# Patient Record
Sex: Male | Born: 2000 | Hispanic: No | Marital: Single | Smoking: Never smoker
Health system: Southern US, Community
[De-identification: ages and names within clinical notes are randomized; demographics above are authoritative.]

---
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
## 2021-02-27 ENCOUNTER — Ambulatory Visit (HOSPITAL_COMMUNITY)
Admission: EM | Admit: 2021-02-27 | Discharge: 2021-02-27 | Disposition: A | Discharge status: Home / Self Care | Payer: Medicaid Other | Attending: Emergency Medicine | Admitting: Emergency Medicine

## 2021-02-27 ENCOUNTER — Encounter (HOSPITAL_COMMUNITY): Payer: Self-pay | Admitting: Emergency Medicine

## 2021-02-27 ENCOUNTER — Other Ambulatory Visit: Payer: Self-pay

## 2021-02-27 DIAGNOSIS — K219 Gastro-esophageal reflux disease without esophagitis: Secondary | ICD-10-CM | POA: Diagnosis not present

## 2021-02-27 MED ORDER — FAMOTIDINE 20 MG PO TABS: 20.0000 mg | ORAL_TABLET | Freq: Two times a day (BID) | ORAL | 0 refills | Status: DC

## 2021-02-27 NOTE — ED Provider Notes (Signed)
[facility]    CSN: [id number] Arrival date & time: 02/27/21  1245      History   Chief Complaint No chief complaint on file.   HPI Jeremy Buckley is a 20 y.o. male.   Patient presents with pain in the center of his chest for 1 month.  Patient endorses that pain is constant with increases with intensity at times. Worsened by movement. Denies shortness of breath, coughing, wheezing, difficulty breathing, headaches, blurred vision, abdominal pain, nausea, vomiting, diarrhea, heartburn, indigestion.  No personal cardiac history.  No familial cardiac history.  Has not attempted treatment of symptoms.  No pertinent medical history  History reviewed. No pertinent past medical history.  There are no problems to display for this patient.   History reviewed. No pertinent surgical history.     Home Medications    Prior to Admission medications   Medication Sig Start Date End Date Taking? Authorizing Provider  famotidine (PEPCID) 20 MG tablet Take 1 tablet (20 mg total) by mouth 2 (two) times daily. 02/27/21  Yes Maxime Beckner, Elita Boone, NP  cetirizine (ZYRTEC) 10 MG tablet Take 1 tablet (10 mg total) by mouth at bedtime. 09/14/13   Baker, Adrian Blackwater, PA-C  triamcinolone (NASACORT AQ) 55 MCG/ACT AERO nasal inhaler Place 2 sprays into the nose daily. 01/13/16   Isa Rankin, MD  Triamcinolone Acetonide (TRIAMCINOLONE 0.1 % CREAM : EUCERIN) CREA Apply 1 application topically 2 (two) times daily. Equal parts Eucerin face cream and triamcinolone 0.1% cream.  Please administer about 50 grams total combined product 09/14/13   Excell Seltzer, Adrian Blackwater, PA-C  valACYclovir (VALTREX) 1000 MG tablet Take 1 tablet (1,000 mg total) by mouth 3 (three) times daily. 01/12/17   Hayden Rasmussen, NP    Family History History reviewed. No pertinent family history.  Social History Social History   Tobacco Use   Smoking status: Never   Smokeless tobacco: Never  Substance Use Topics   Alcohol use: No   Drug  use: No     Allergies   Patient has no known allergies.   Review of Systems Review of Systems  Constitutional: Negative.   HENT: Negative.    Respiratory:  Negative for apnea, cough, choking, chest tightness, shortness of breath, wheezing and stridor.   Cardiovascular:  Positive for chest pain. Negative for palpitations and leg swelling.  Skin: Negative.   Neurological: Negative.     Physical Exam Triage Vital Signs ED Triage Vitals [02/27/21 1352]  Enc Vitals Group     BP 111/75     Pulse Rate 87     Resp 18     Temp 98.3 F (36.8 C)     Temp src      SpO2 96 %     Weight      Height      Head Circumference      Peak Flow      Pain Score 7     Pain Loc      Pain Edu?      Excl. in GC?    No data found.  Updated Vital Signs BP 111/75   Pulse 87   Temp 98.3 F (36.8 C)   Resp 18   SpO2 96%   Visual Acuity Right Eye Distance:   Left Eye Distance:   Bilateral Distance:    Right Eye Near:   Left Eye Near:    Bilateral Near:     Physical Exam Constitutional:      Appearance:  Normal appearance. He is normal weight.  HENT:     Head: Normocephalic.  Eyes:     Extraocular Movements: Extraocular movements intact.  Cardiovascular:     Rate and Rhythm: Normal rate and regular rhythm.     Pulses: Normal pulses.     Heart sounds: Normal heart sounds.  Pulmonary:     Effort: Pulmonary effort is normal.     Breath sounds: Normal breath sounds.  Abdominal:     General: Abdomen is flat. Bowel sounds are normal.     Palpations: Abdomen is soft.  Skin:    General: Skin is warm and dry.  Neurological:     Mental Status: He is alert and oriented to person, place, and time. Mental status is at baseline.  Psychiatric:        Mood and Affect: Mood normal.        Behavior: Behavior normal.     UC Treatments / Results  Labs (all labs ordered are listed, but only abnormal results are displayed) Labs Reviewed - No data to display  EKG   Radiology No  results found.  Procedures Procedures (including critical care time)  Medications Ordered in UC Medications - No data to display  Initial Impression / Assessment and Plan / UC Course  I have reviewed the triage vital signs and the nursing notes.  Pertinent labs & imaging results that were available during my care of the patient were reviewed by me and considered in my medical decision making (see chart for details).  Gerd without esophagitis  Low suspicion of cardiac involvement, vital signs stable, no personal or familial history, EKG normal today, however if symptoms persist recommended follow-up with heart care cardiology for further evaluation given return precautions that if symptoms worsen please go to the nearest emergency department  1.  EKG normal sinus rhythm heart rate 83 2. Famotidine 20 mg bid for 14 days 3.  May use over-the-counter antidiarrhea medication in addition  Final Clinical Impressions(s) / UC Diagnoses   Final diagnoses:  GERD without esophagitis     Discharge Instructions      Your EKG shows that your heart is beating in a normal pace and rhythm, I have low suspicion of your symptoms being caused by your heart due to your age your history and examination today  If symptoms become more severe at any point please come to the nearest emergency department for further evaluation  If your symptoms continue but do not worsen please follow-up with the heart care cardiology group for further evaluation, information listed below  In the meantime we will attempt to resolve your symptoms by treating for gas which can mimic heart pain  Take famotidine twice a day for the next 14 days  Can use over-the-counter anti-gas medication such as Tums, Maalox etc. for additional relief   ED Prescriptions     Medication Sig Dispense Auth. Provider   famotidine (PEPCID) 20 MG tablet Take 1 tablet (20 mg total) by mouth 2 (two) times daily. 30 tablet Valinda Hoar,  NP      PDMP not reviewed this encounter.   Valinda Hoar, NP 02/27/21 1441

## 2021-02-27 NOTE — Discharge Instructions (Addendum)
Your EKG shows that your heart is beating in a normal pace and rhythm, I have low suspicion of your symptoms being caused by your heart due to your age your history and examination today  If symptoms become more severe at any point please come to the nearest emergency department for further evaluation  If your symptoms continue but do not worsen please follow-up with the heart care cardiology group for further evaluation, information listed below  In the meantime we will attempt to resolve your symptoms by treating for gas which can mimic heart pain  Take famotidine twice a day for the next 14 days  Can use over-the-counter anti-gas medication such as Tums, Maalox etc. for additional relief

## 2021-02-27 NOTE — ED Triage Notes (Signed)
Pt is present today with left side pain near his chest. Pt states pain started one month ago

## 2021-09-24 ENCOUNTER — Other Ambulatory Visit: Payer: Self-pay | Admitting: Obstetrics and Gynecology

## 2021-09-24 ENCOUNTER — Ambulatory Visit
Admission: RE | Admit: 2021-09-24 | Discharge: 2021-09-24 | Disposition: A | Discharge status: Home / Self Care | Payer: No Typology Code available for payment source | Source: Ambulatory Visit | Attending: Obstetrics and Gynecology | Admitting: Obstetrics and Gynecology

## 2021-09-24 DIAGNOSIS — R7611 Nonspecific reaction to tuberculin skin test without active tuberculosis: Secondary | ICD-10-CM

## 2021-11-18 ENCOUNTER — Emergency Department (HOSPITAL_COMMUNITY)
Admission: EM | Admit: 2021-11-18 | Discharge: 2021-11-18 | Disposition: A | Discharge status: Home / Self Care | Payer: Medicaid Other | Attending: Emergency Medicine | Admitting: Emergency Medicine

## 2021-11-18 ENCOUNTER — Encounter (HOSPITAL_COMMUNITY): Payer: Self-pay

## 2021-11-18 DIAGNOSIS — G4700 Insomnia, unspecified: Secondary | ICD-10-CM | POA: Diagnosis present

## 2021-11-18 DIAGNOSIS — G479 Sleep disorder, unspecified: Secondary | ICD-10-CM

## 2021-11-18 NOTE — ED Triage Notes (Signed)
Pt states that he has not slept in two days and has not tried anything at home.

## 2021-11-18 NOTE — Discharge Instructions (Addendum)
We recommend use of over the counter sleep aids such as Melatonin or Unisom. Follow up with a primary care doctor.

## 2021-11-20 NOTE — ED Provider Notes (Signed)
[facility] EMERGENCY DEPARTMENT Provider Note   CSN: [id number] Arrival date & time: 11/18/21  2202     History  Chief Complaint  Patient presents with   Insomnia    Jeremy Buckley is a 21 y.o. male.  21 year old male presents to the emergency department requesting prescription of a "strong" sleep aid as he has not been able to sleep in the past 2 days.  He has not attempted any interventions or utilized any over-the-counter medications for symptoms.  Denies any pain complaints.  Denies history of insomnia, use of stimulants, drug use.  The history is provided by the patient. No language interpreter was used.  Insomnia       Home Medications Prior to Admission medications   Medication Sig Start Date End Date Taking? Authorizing Provider  cetirizine (ZYRTEC) 10 MG tablet Take 1 tablet (10 mg total) by mouth at bedtime. 09/14/13   Excell Seltzer, Adrian Blackwater, PA-C  famotidine (PEPCID) 20 MG tablet Take 1 tablet (20 mg total) by mouth 2 (two) times daily. 02/27/21   White, Elita Boone, NP  triamcinolone (NASACORT AQ) 55 MCG/ACT AERO nasal inhaler Place 2 sprays into the nose daily. 01/13/16   Isa Rankin, MD  Triamcinolone Acetonide (TRIAMCINOLONE 0.1 % CREAM : EUCERIN) CREA Apply 1 application topically 2 (two) times daily. Equal parts Eucerin face cream and triamcinolone 0.1% cream.  Please administer about 50 grams total combined product 09/14/13   Excell Seltzer, Adrian Blackwater, PA-C  valACYclovir (VALTREX) 1000 MG tablet Take 1 tablet (1,000 mg total) by mouth 3 (three) times daily. 01/12/17   Hayden Rasmussen, NP      Allergies    Patient has no known allergies.    Review of Systems   Review of Systems  Psychiatric/Behavioral:  The patient has insomnia.   Ten systems reviewed and are negative for acute change, except as noted in the HPI.    Physical Exam Updated Vital Signs There were no vitals taken for this visit. Physical Exam Vitals and nursing note reviewed.  Constitutional:       General: He is not in acute distress.    Appearance: He is well-developed. He is not diaphoretic.  HENT:     Head: Normocephalic and atraumatic.  Eyes:     General: No scleral icterus.    Conjunctiva/sclera: Conjunctivae normal.  Pulmonary:     Effort: Pulmonary effort is normal. No respiratory distress.  Musculoskeletal:        General: Normal range of motion.     Cervical back: Normal range of motion.  Skin:    General: Skin is warm and dry.     Coloration: Skin is not pale.     Findings: No erythema or rash.  Neurological:     Mental Status: He is alert and oriented to person, place, and time.  Psychiatric:        Mood and Affect: Mood is elated.        Speech: Speech normal.        Behavior: Behavior normal.     ED Results / Procedures / Treatments   Labs (all labs ordered are listed, but only abnormal results are displayed) Labs Reviewed - No data to display  EKG None  Radiology No results found.  Procedures Procedures    Medications Ordered in ED Medications - No data to display  ED Course/ Medical Decision Making/ A&P  Medical Decision Making  This patient presents to the ED for concern of sleep disturbance, this involves an extensive number of treatment options, and is a complaint that carries with it a high risk of complications and morbidity.  The differential diagnosis includes mania vs uncomplicated insomnia vs substance use/abuse   Co morbidities that complicate the patient evaluation  None   Reevaluation:  After the interventions noted above, I reevaluated the patient and found that they have : remained stable   Dispostion:  After consideration of the diagnostic results and the patients response to treatment, I feel that the patent would benefit from use of OTC sleep aid such as Benadryl, Unisom, or Melatonin. Encouraged PCP follow up for persistent symptoms. Return precautions discussed and provided. Patient  discharged in stable condition with no unaddressed concerns.          Final Clinical Impression(s) / ED Diagnoses Final diagnoses:  Difficulty sleeping    Rx / DC Orders ED Discharge Orders     None         Antony Madura, PA-C 11/20/21 0125    Tilden Fossa, MD 11/21/21 1736

## 2022-11-02 IMAGING — CR DG CHEST 1V
1 series · 1 of 1 positions shown · non-contrast
Comparison: None Available.

CLINICAL DATA: Positive TB test.  Nonsmoker.

EXAM:
CHEST  1 VIEW

[w chest pa]
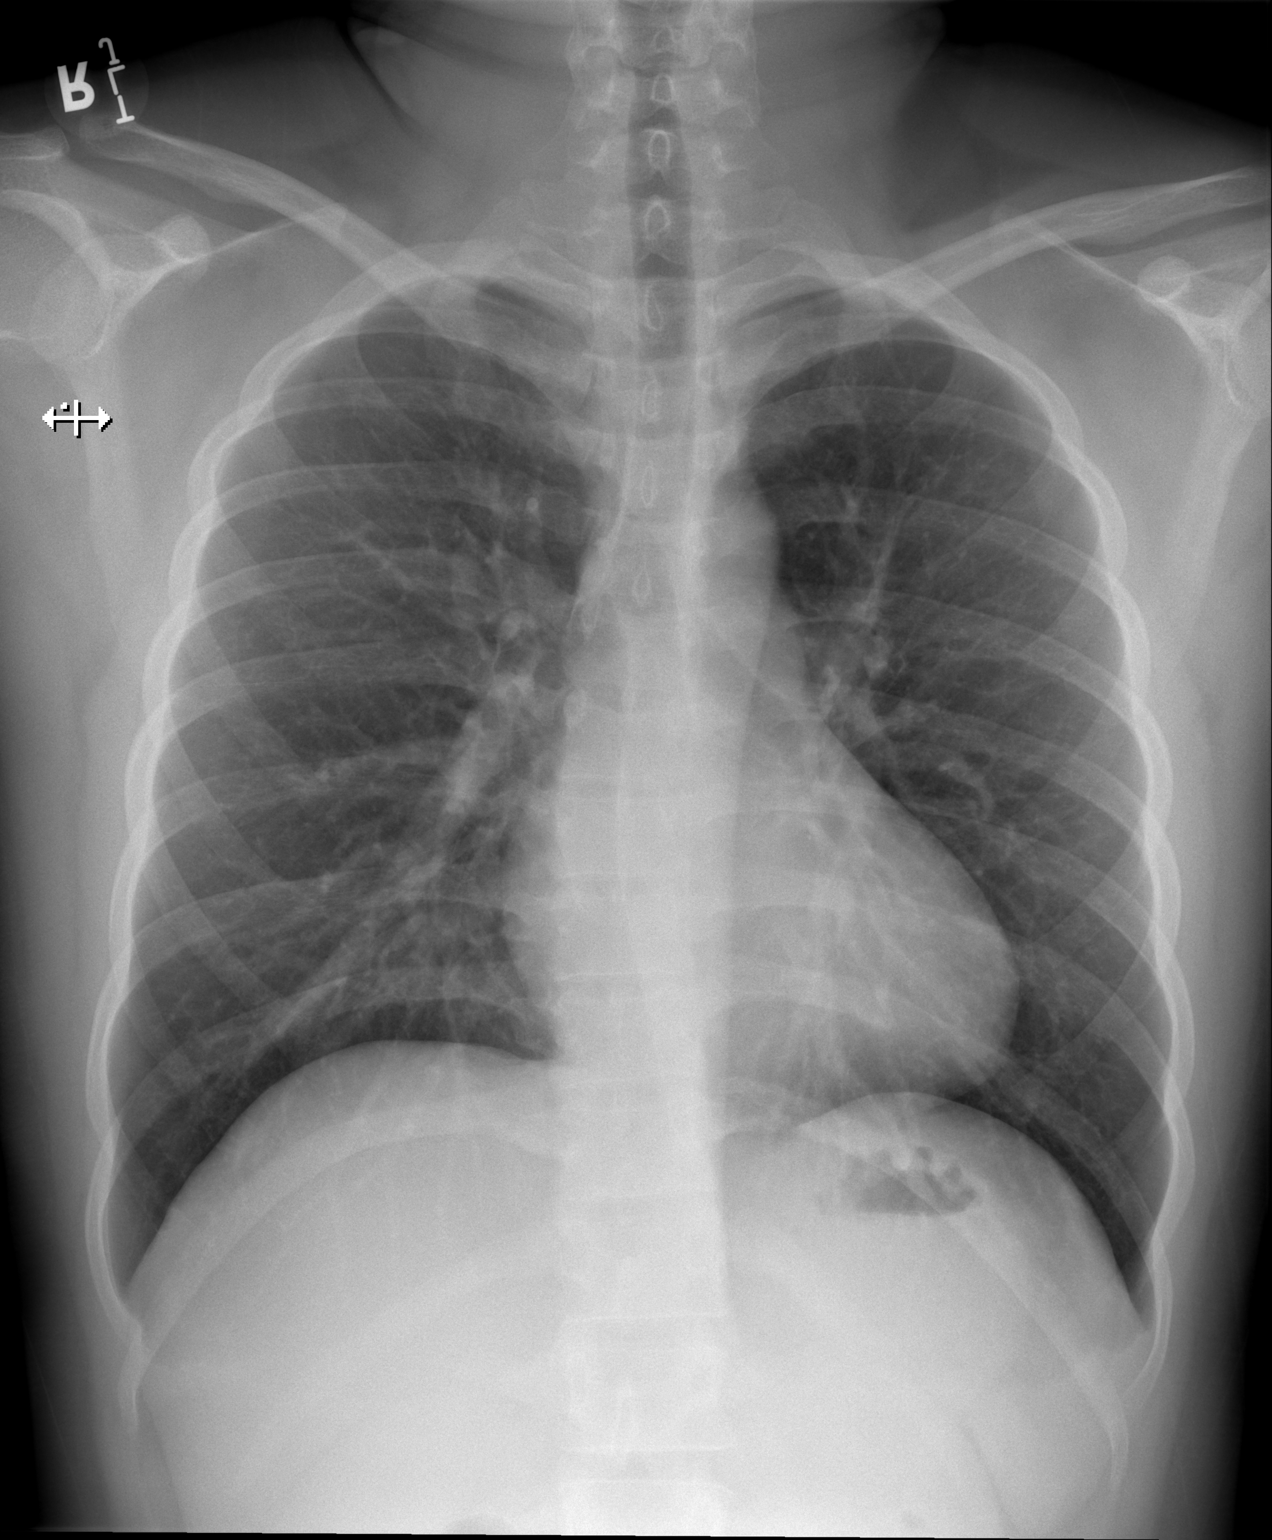

[1 of 1 positions shown; findings below may reference images not displayed]

FINDINGS: The heart size and mediastinal contours are within normal limits.
Both lungs are clear. The visualized skeletal structures are
unremarkable.
IMPRESSION: No active disease.

## 2024-02-10 ENCOUNTER — Other Ambulatory Visit: Payer: Self-pay

## 2024-02-10 ENCOUNTER — Emergency Department (HOSPITAL_COMMUNITY)
Admission: EM | Admit: 2024-02-10 | Discharge: 2024-02-10 | Disposition: A | Discharge status: Home / Self Care | Attending: Emergency Medicine | Admitting: Emergency Medicine

## 2024-02-10 DIAGNOSIS — G510 Bell's palsy: Secondary | ICD-10-CM | POA: Insufficient documentation

## 2024-02-10 DIAGNOSIS — R200 Anesthesia of skin: Secondary | ICD-10-CM | POA: Diagnosis present

## 2024-02-10 DIAGNOSIS — R202 Paresthesia of skin: Secondary | ICD-10-CM

## 2024-02-10 LAB — BASIC METABOLIC PANEL WITH GFR
Anion gap: 12 (ref 5–15)
BUN: 12 mg/dL (ref 6–20)
CO2: 26 mmol/L (ref 22–32)
Calcium: 9.5 mg/dL (ref 8.9–10.3)
Chloride: 99 mmol/L (ref 98–111)
Creatinine, Ser: 0.94 mg/dL (ref 0.61–1.24)
GFR, Estimated: >60 mL/min (ref >60)
Glucose, Bld: 128 mg/dL — ABNORMAL HIGH (ref 70–99)
Potassium: 3.8 mmol/L (ref 3.5–5.1)
Sodium: 137 mmol/L (ref 135–145)

## 2024-02-10 LAB — CBC WITH DIFFERENTIAL/PLATELET
Abs Immature Granulocytes: 0.01 K/uL (ref 0.00–0.07)
Basophils Absolute: 0 K/uL (ref 0.0–0.1)
Basophils Relative: 0 %
Eosinophils Absolute: 0.1 K/uL (ref 0.0–0.5)
Eosinophils Relative: 1 %
HCT: 48.2 % (ref 39.0–52.0)
Hemoglobin: 16 g/dL (ref 13.0–17.0)
Immature Granulocytes: 0 %
Lymphocytes Relative: 25 %
Lymphs Abs: 1.1 K/uL (ref 0.7–4.0)
MCH: 28.4 pg (ref 26.0–34.0)
MCHC: 33.2 g/dL (ref 30.0–36.0)
MCV: 85.6 fL (ref 80.0–100.0)
Monocytes Absolute: 0.5 K/uL (ref 0.1–1.0)
Monocytes Relative: 10 %
Neutro Abs: 2.8 K/uL (ref 1.7–7.7)
Neutrophils Relative %: 64 %
Platelets: 270 K/uL (ref 150–400)
RBC: 5.63 MIL/uL (ref 4.22–5.81)
RDW: 12.2 % (ref 11.5–15.5)
WBC: 4.5 K/uL (ref 4.0–10.5)
nRBC: 0 % (ref 0.0–0.2)

## 2024-02-10 MED ORDER — ACETAMINOPHEN 325 MG PO TABS
650.0000 mg | ORAL_TABLET | Freq: Once | ORAL | Status: AC
  Administered 2024-02-10: 650 mg via ORAL
  Filled 2024-02-10: qty 2

## 2024-02-10 MED ORDER — PREDNISONE 20 MG PO TABS: ORAL_TABLET | ORAL | 0 refills | Status: AC

## 2024-02-10 MED ORDER — VALACYCLOVIR HCL 1 G PO TABS: 1000.0000 mg | ORAL_TABLET | Freq: Three times a day (TID) | ORAL | 0 refills | Status: AC

## 2024-02-10 NOTE — ED Provider Notes (Signed)
 [name] Provider Note   CSN: [id number] Arrival date & time: 02/10/24  1115     Patient presents with: No chief complaint on file.   Jeremy Buckley is a 23 y.o. male.   Pt indicates since yesterday has noticed a numbness/tingling/burning sensation to right side of face involving entire side of face. No rash to area. No eye pain, redness or tearing. No swelling. Denies ear pain, tinnitus or hearing loss. No extremity numbness or weakness or loss of normal functional ability or change in normal balance or gait.  No change in speech or vision. No headache or head trauma. No syncope. No fever or chills. Indicates otherwise does not feel acutely sick or ill.   The history is provided by the patient and medical records.       Prior to Admission medications   Medication Sig Start Date End Date Taking? Authorizing Provider  cetirizine  (ZYRTEC ) 10 MG tablet Take 1 tablet (10 mg total) by mouth at bedtime. 09/14/13   Baker, Zachary H, PA-C  famotidine  (PEPCID ) 20 MG tablet Take 1 tablet (20 mg total) by mouth 2 (two) times daily. 02/27/21   White, Shelba SAUNDERS, NP  triamcinolone  (NASACORT  AQ) 55 MCG/ACT AERO nasal inhaler Place 2 sprays into the nose daily. 01/13/16   Jason Leita Blush, MD  Triamcinolone  Acetonide (TRIAMCINOLONE  0.1 % CREAM : EUCERIN) CREA Apply 1 application topically 2 (two) times daily. Equal parts Eucerin face cream and triamcinolone  0.1% cream.  Please administer about 50 grams total combined product 09/14/13   Baker, Zachary H, PA-C  valACYclovir  (VALTREX ) 1000 MG tablet Take 1 tablet (1,000 mg total) by mouth 3 (three) times daily. 01/12/17   Tharon Lenis, NP    Allergies: Patient has no known allergies.    Review of Systems  Constitutional:  Negative for chills and fever.  HENT:  Negative for rhinorrhea, sinus pain, sore throat and trouble swallowing.   Eyes:  Negative for pain, redness and visual disturbance.  Respiratory:   Negative for shortness of breath.   Cardiovascular:  Negative for chest pain.  Gastrointestinal:  Negative for abdominal pain, nausea and vomiting.  Genitourinary:  Negative for flank pain.  Musculoskeletal:  Negative for back pain, neck pain and neck stiffness.  Skin:  Negative for rash.  Neurological:  Negative for speech difficulty and headaches.  Psychiatric/Behavioral:  Negative for confusion.     Updated Vital Signs BP 115/85 (BP Location: Left Arm)   Pulse 88   Temp 98.1 F (36.7 C)   Resp 20   Ht 1.727 m (5' 8)   Wt 61.2 kg   SpO2 98%   BMI 20.53 kg/m   Physical Exam Vitals and nursing note reviewed.  Constitutional:      Appearance: Normal appearance. He is well-developed.  HENT:     Head: Atraumatic.     Comments: No sinus, temporal, or mastoid tenderness.     Right Ear: Tympanic membrane and ear canal normal.     Left Ear: Tympanic membrane and ear canal normal.     Nose: Nose normal.     Mouth/Throat:     Mouth: Mucous membranes are moist.     Pharynx: Oropharynx is clear.  Eyes:     General: No scleral icterus.    Extraocular Movements: Extraocular movements intact.     Conjunctiva/sclera: Conjunctivae normal.     Pupils: Pupils are equal, round, and reactive to light.  Neck:     Vascular:  No carotid bruit.     Trachea: No tracheal deviation.     Comments: Trachea midline, thyroid not grossly enlarged or tender, no neck stiffness or rigidity.  Cardiovascular:     Rate and Rhythm: Normal rate and regular rhythm.     Pulses: Normal pulses.     Heart sounds: Normal heart sounds. No murmur heard.    No friction rub. No gallop.  Pulmonary:     Effort: Pulmonary effort is normal. No accessory muscle usage or respiratory distress.     Breath sounds: Normal breath sounds.  Abdominal:     General: There is no distension.     Palpations: Abdomen is soft.     Tenderness: There is no abdominal tenderness.  Musculoskeletal:        General: No swelling or  tenderness.     Cervical back: Normal range of motion and neck supple. No rigidity.     Right lower leg: No edema.     Left lower leg: No edema.     Comments: C spine non tender, normal rom.   Skin:    General: Skin is warm and dry.     Findings: No rash.  Neurological:     Mental Status: He is alert.     Comments: Alert, speech clear, no dysarthria or aphasia. ? Very mild right-sided facial weakness including forehead c/w early/mild/partial Bell's Palsy. Is able to fully close eye. On extremity exam, motor/sens fxn grossly intact. Stre 5/5. No pronator drift. Steady gait.   Psychiatric:        Mood and Affect: Mood normal.     (all labs ordered are listed, but only abnormal results are displayed) Results for orders placed or performed during the hospital encounter of 02/10/24  Basic metabolic panel   Collection Time: 02/10/24 11:41 AM  Result Value Ref Range   Sodium 137 135 - 145 mmol/L   Potassium 3.8 3.5 - 5.1 mmol/L   Chloride 99 98 - 111 mmol/L   CO2 26 22 - 32 mmol/L   Glucose, Bld 128 (H) 70 - 99 mg/dL   BUN 12 6 - 20 mg/dL   Creatinine, Ser 9.05 0.61 - 1.24 mg/dL   Calcium 9.5 8.9 - 89.6 mg/dL   GFR, Estimated >39 >39 mL/min   Anion gap 12 5 - 15  CBC with Differential   Collection Time: 02/10/24 11:41 AM  Result Value Ref Range   WBC 4.5 4.0 - 10.5 K/uL   RBC 5.63 4.22 - 5.81 MIL/uL   Hemoglobin 16.0 13.0 - 17.0 g/dL   HCT 51.7 60.9 - 47.9 %   MCV 85.6 80.0 - 100.0 fL   MCH 28.4 26.0 - 34.0 pg   MCHC 33.2 30.0 - 36.0 g/dL   RDW 87.7 88.4 - 84.4 %   Platelets 270 150 - 400 K/uL   nRBC 0.0 0.0 - 0.2 %   Neutrophils Relative % 64 %   Neutro Abs 2.8 1.7 - 7.7 K/uL   Lymphocytes Relative 25 %   Lymphs Abs 1.1 0.7 - 4.0 K/uL   Monocytes Relative 10 %   Monocytes Absolute 0.5 0.1 - 1.0 K/uL   Eosinophils Relative 1 %   Eosinophils Absolute 0.1 0.0 - 0.5 K/uL   Basophils Relative 0 %   Basophils Absolute 0.0 0.0 - 0.1 K/uL   Immature Granulocytes 0 %   Abs  Immature Granulocytes 0.01 0.00 - 0.07 K/uL    EKG: None  Radiology: No results found.  Procedures   Medications Ordered in the ED  acetaminophen (TYLENOL) tablet 650 mg (650 mg Oral Given 02/10/24 1340)                                    Medical Decision Making Problems Addressed: Bell's palsy: acute illness or injury with systemic symptoms that poses a threat to life or bodily functions Paresthesia: acute illness or injury  Amount and/or Complexity of Data Reviewed External Data Reviewed: notes. Labs: ordered. Decision-making details documented in ED Course.  Risk Prescription drug management.   Labs ordered/sent.  Differential diagnosis includes Bell's Palsy, paresthesias, etc. Dispo decision including potential need for admission considered - will get labs and reassess.   Reviewed nursing notes and prior charts for additional history. External reports reviewed.  Labs reviewed/interpreted by me - chem unremarkable, normal. Wbc and hgb normal.   Symptoms and exam appear most c/w early/mild Bell's Palsy.  Will give rx for home. Confirmed nkda.   Rec close pcp f/u.  Return precautions provided.       Final diagnoses:  None    ED Discharge Orders     None          Bernard Drivers, MD 02/10/24 [phone]

## 2024-02-10 NOTE — ED Triage Notes (Signed)
 Pt c/o pins and needles sensation on right side of his face that started yesterday.

## 2024-02-10 NOTE — ED Provider Triage Note (Signed)
 Emergency Medicine Provider Triage Evaluation Note  Jeremy Buckley , a 23 y.o. male  was evaluated in triage.  Pt complains of pins-and-needles sensation to the right side of his face.  Review of Systems  Positive: Facial paresthesias Negative: Dental problem, fever  Physical Exam  BP (!) 129/91 (BP Location: Left Arm)   Pulse 87   Temp 98.2 F (36.8 C)   Resp 18   Ht 5' 8 (1.727 m)   Wt 61.2 kg   SpO2 99%   BMI 20.53 kg/m  Gen:   Awake, no distress   Resp:  Normal effort  MSK:   Moves extremities without difficulty  Other:    Medical Decision Making  Medically screening exam initiated at 11:41 AM.  Appropriate orders placed.  Munachimso Ging was informed that the remainder of the evaluation will be completed by another provider, this initial triage assessment does not replace that evaluation, and the importance of remaining in the ED until their evaluation is complete.     Towana Ozell BROCKS, MD 02/10/24 1141

## 2024-02-10 NOTE — Discharge Instructions (Signed)
 It was our pleasure to provide your ER care today - we hope that you feel better.  Take meds as prescribed.  Also use moistening/lubricating eye drops to help keep eye moist, not irritated, and to protect from drying.  Follow up closely with primary care doctor in one week.   Return to ER if worse, new symptoms, fevers, new/severe pain, severe headache, severe eye pain, new change in speech or vision, extremity numbness/weakness, or other concern.

## 2024-03-01 ENCOUNTER — Ambulatory Visit (HOSPITAL_COMMUNITY): Admission: EM | Admit: 2024-03-01 | Discharge: 2024-03-01 | Disposition: A | Discharge status: Home / Self Care

## 2024-03-01 ENCOUNTER — Encounter (HOSPITAL_COMMUNITY): Payer: Self-pay | Admitting: *Deleted

## 2024-03-01 DIAGNOSIS — L03012 Cellulitis of left finger: Secondary | ICD-10-CM | POA: Diagnosis not present

## 2024-03-01 MED ORDER — CEPHALEXIN 500 MG PO CAPS: 500.0000 mg | ORAL_CAPSULE | Freq: Four times a day (QID) | ORAL | 0 refills | Status: AC

## 2024-03-01 NOTE — ED Triage Notes (Signed)
 Pt states he was stung on left ring finger X 4 days. The tip of the finger is red and swollen. He has not taken any OTC meds.

## 2024-03-01 NOTE — Discharge Instructions (Signed)
 Use cool compresses to area and be sure to complete antibiotics in their entirety.

## 2024-03-01 NOTE — ED Provider Notes (Addendum)
 [facility]    CSN: [id number] Arrival date & time: 03/01/24  1315      History   Chief Complaint Chief Complaint  Patient presents with   Insect Bite    HPI Jeremy Buckley is a 23 y.o. male.   Patient presents today due to wasp sting ventral aspect of distal ring finger of left hand 4 days ago.  Patient states that he is experiencing spreading erythema and pulsatile pain.  Patient denies itching.  Patient states he took acetaminophen for pain with mild relief.  The history is provided by the patient.    History reviewed. No pertinent past medical history.  There are no active problems to display for this patient.   History reviewed. No pertinent surgical history.     Home Medications    Prior to Admission medications   Medication Sig Start Date End Date Taking? Authorizing Provider  cephALEXin (KEFLEX) 500 MG capsule Take 1 capsule (500 mg total) by mouth 4 (four) times daily for 7 days. 03/01/24 03/08/24 Yes Andra Corean BROCKS, PA-C  predniSONE (DELTASONE) 20 MG tablet 3 po once a day for 2 days, then 2 po once a day for 3 days, then 1 po once a day for 3 days 02/10/24   Bernard Drivers, MD  valACYclovir  (VALTREX ) 1000 MG tablet Take 1 tablet (1,000 mg total) by mouth 3 (three) times daily. 02/10/24   Bernard Drivers, MD    Family History History reviewed. No pertinent family history.  Social History Social History   Tobacco Use   Smoking status: Never   Smokeless tobacco: Never  Vaping Use   Vaping status: Never Used  Substance Use Topics   Alcohol use: No   Drug use: No     Allergies   Patient has no known allergies.   Review of Systems Review of Systems   Physical Exam Triage Vital Signs ED Triage Vitals  Encounter Vitals Group     BP 03/01/24 1350 105/83     Girls Systolic BP Percentile --      Girls Diastolic BP Percentile --      Boys Systolic BP Percentile --      Boys Diastolic BP Percentile --      Pulse Rate 03/01/24 1350 91      Resp 03/01/24 1350 16     Temp 03/01/24 1350 98.7 F (37.1 C)     Temp Source 03/01/24 1350 Oral     SpO2 03/01/24 1350 99 %     Weight --      Height --      Head Circumference --      Peak Flow --      Pain Score 03/01/24 1349 0     Pain Loc --      Pain Education --      Exclude from Growth Chart --    No data found.  Updated Vital Signs BP 105/83 (BP Location: Right Arm)   Pulse 91   Temp 98.7 F (37.1 C) (Oral)   Resp 16   SpO2 99%   Visual Acuity Right Eye Distance:   Left Eye Distance:   Bilateral Distance:    Right Eye Near:   Left Eye Near:    Bilateral Near:     Physical Exam Vitals and nursing note reviewed.  Constitutional:      General: He is not in acute distress.    Appearance: Normal appearance. He is not ill-appearing, toxic-appearing or diaphoretic.  Eyes:  General: No scleral icterus. Cardiovascular:     Rate and Rhythm: Normal rate and regular rhythm.     Heart sounds: Normal heart sounds.  Pulmonary:     Effort: Pulmonary effort is normal. No respiratory distress.     Breath sounds: Normal breath sounds. No wheezing or rhonchi.  Musculoskeletal:     Left hand: Swelling and tenderness present.     Comments: Mild tenderness, erythema, and edema of ventral aspect of distal left ring finger  Skin:    General: Skin is warm.  Neurological:     Mental Status: He is alert and oriented to person, place, and time.  Psychiatric:        Mood and Affect: Mood normal.        Behavior: Behavior normal.      UC Treatments / Results  Labs (all labs ordered are listed, but only abnormal results are displayed) Labs Reviewed - No data to display  EKG   Radiology No results found.  Procedures Procedures (including critical care time)  Medications Ordered in UC Medications - No data to display  Initial Impression / Assessment and Plan / UC Course  I have reviewed the triage vital signs and the nursing notes.  Pertinent labs & imaging  results that were available during my care of the patient were reviewed by me and considered in my medical decision making (see chart for details).     Patient was given antibiotics to cover for cellulitis of finger and advised to use ibuprofen and Tylenol as needed for pain. Final Clinical Impressions(s) / UC Diagnoses   Final diagnoses:  Cellulitis of ring finger of left hand     Discharge Instructions      Use cool compresses to area and be sure to complete antibiotics in their entirety.     ED Prescriptions     Medication Sig Dispense Auth. Provider   cephALEXin (KEFLEX) 500 MG capsule Take 1 capsule (500 mg total) by mouth 4 (four) times daily for 7 days. 28 capsule Andra Corean BROCKS, PA-C      PDMP not reviewed this encounter.   Andra Corean BROCKS, PA-C 03/01/24 1503    Andra Corean BROCKS, PA-C 03/01/24 1513
